# Patient Record
Sex: Female | Born: 2000
Health system: Southern US, Community
[De-identification: ages and names within clinical notes are randomized; demographics above are authoritative.]

## PROBLEM LIST (undated history)

## (undated) DIAGNOSIS — M419 Scoliosis, unspecified: Secondary | ICD-10-CM

## (undated) DIAGNOSIS — F988 Other specified behavioral and emotional disorders with onset usually occurring in childhood and adolescence: Secondary | ICD-10-CM

## (undated) DIAGNOSIS — T7840XA Allergy, unspecified, initial encounter: Secondary | ICD-10-CM

## (undated) HISTORY — PX: WISDOM TOOTH EXTRACTION: SHX21

## (undated) HISTORY — DX: Other specified behavioral and emotional disorders with onset usually occurring in childhood and adolescence: F98.8

## (undated) HISTORY — PX: SPINE SURGERY: SHX786

## (undated) HISTORY — DX: Scoliosis, unspecified: M41.9

## (undated) HISTORY — DX: Allergy, unspecified, initial encounter: T78.40XA

## (undated) NOTE — ED Notes (Signed)
 Formatting of this note might be different from the original. FU with pcp as needed. Return for worsening pain, hematuria, any concerns.  Tylenol  otc as directed prn pain.  Increase fluids.  Ice packs to back swelling as needed.   Delon Hadassah Dolly, RN 08/19/11 1627 Electronically signed by Delon Hadassah Dolly, RN at 08/19/2011  4:27 PM EDT
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
## 2019-02-10 ENCOUNTER — Ambulatory Visit: Payer: 59 | Attending: Internal Medicine

## 2019-02-10 DIAGNOSIS — Z20822 Contact with and (suspected) exposure to covid-19: Secondary | ICD-10-CM | POA: Insufficient documentation

## 2019-02-12 LAB — NOVEL CORONAVIRUS, NAA: SARS-CoV-2, NAA: NOT DETECTED

## 2019-02-28 ENCOUNTER — Telehealth: Payer: Self-pay | Admitting: Certified Nurse Midwife

## 2019-02-28 ENCOUNTER — Ambulatory Visit: Payer: 59

## 2019-03-02 NOTE — Telephone Encounter (Signed)
error 

## 2019-03-07 ENCOUNTER — Other Ambulatory Visit: Payer: Self-pay

## 2019-03-07 ENCOUNTER — Ambulatory Visit (INDEPENDENT_AMBULATORY_CARE_PROVIDER_SITE_OTHER): Payer: 59 | Admitting: Certified Nurse Midwife

## 2019-03-07 VITALS — BP 103/62 | HR 82 | Ht 67.0 in | Wt 153.8 lb

## 2019-03-07 DIAGNOSIS — Z3042 Encounter for surveillance of injectable contraceptive: Secondary | ICD-10-CM

## 2019-03-07 MED ORDER — MEDROXYPROGESTERONE ACETATE 150 MG/ML IM SUSP
150.0000 mg | Freq: Once | INTRAMUSCULAR | Status: AC
  Administered 2019-03-07: 150 mg via INTRAMUSCULAR

## 2019-03-07 NOTE — Progress Notes (Signed)
Date last pap: N/A. Last Depo-Provera: 12/13/2018. Side Effects if any: None, per patient. Serum HCG indicated? N/A, patient is within dates. Depo-Provera 150 mg IM given by: Park Meo, CMA. Next appointment due 4/19-06/06/2019.  BP 103/62   Pulse 82   Ht 5\' 7"  (1.702 m)   Wt 153 lb 12.8 oz (69.8 kg)   LMP  (LMP Unknown)   BMI 24.09 kg/m

## 2019-05-31 ENCOUNTER — Ambulatory Visit: Payer: 59

## 2019-06-01 NOTE — Progress Notes (Signed)
Date last pap: underaged. Last Depo-Provera: 03/07/2019. Side Effects if any: none. Serum HCG indicated? n/a. Depo-Provera 150 mg IM given by: Billy Fischer, LPN. Next appointment due July 15-29, 2021.   BP 125/73   Pulse 79   Ht 5\' 7"  (1.702 m)   Wt 159 lb 1.6 oz (72.2 kg)   BMI 24.92 kg/m

## 2019-06-02 ENCOUNTER — Other Ambulatory Visit: Payer: Self-pay

## 2019-06-02 ENCOUNTER — Ambulatory Visit (INDEPENDENT_AMBULATORY_CARE_PROVIDER_SITE_OTHER): Payer: 59 | Admitting: Certified Nurse Midwife

## 2019-06-02 ENCOUNTER — Other Ambulatory Visit: Payer: Self-pay | Admitting: Certified Nurse Midwife

## 2019-06-02 VITALS — BP 125/73 | HR 79 | Ht 67.0 in | Wt 159.1 lb

## 2019-06-02 DIAGNOSIS — Z3042 Encounter for surveillance of injectable contraceptive: Secondary | ICD-10-CM

## 2019-06-02 MED ORDER — MEDROXYPROGESTERONE ACETATE 150 MG/ML IM SUSP: 150.0000 mg | Freq: Once | INTRAMUSCULAR | Status: DC

## 2019-06-02 MED ORDER — MEDROXYPROGESTERONE ACETATE 150 MG/ML IM SUSP
150.0000 mg | Freq: Once | INTRAMUSCULAR | Status: AC
  Administered 2019-06-02: 150 mg via INTRAMUSCULAR

## 2019-06-02 NOTE — Telephone Encounter (Signed)
Pt is at the pharmacy and her depo in expired. She is requesting a refill. Sent to [facility]. Please advise pt has appt today

## 2019-08-30 ENCOUNTER — Ambulatory Visit: Payer: 59

## 2019-09-01 ENCOUNTER — Ambulatory Visit (INDEPENDENT_AMBULATORY_CARE_PROVIDER_SITE_OTHER): Payer: 59

## 2019-09-01 DIAGNOSIS — Z3042 Encounter for surveillance of injectable contraceptive: Secondary | ICD-10-CM

## 2019-09-01 MED ORDER — MEDROXYPROGESTERONE ACETATE 150 MG/ML IM SUSP
150.0000 mg | Freq: Once | INTRAMUSCULAR | Status: AC
  Administered 2019-09-01: 150 mg via INTRAMUSCULAR

## 2019-09-01 NOTE — Progress Notes (Signed)
Date last pap: underage. Last Depo-Provera: 06/02/2019 . Side Effects if any: none Serum HCG indicated? n/a Depo-Provera 150 mg IM given by: FHampton, LPN. Next appointment due Oct 14-Dec 01, 2019.

## 2019-10-12 DIAGNOSIS — H5213 Myopia, bilateral: Secondary | ICD-10-CM | POA: Diagnosis not present

## 2019-11-29 ENCOUNTER — Ambulatory Visit (INDEPENDENT_AMBULATORY_CARE_PROVIDER_SITE_OTHER): Payer: 59 | Admitting: Certified Nurse Midwife

## 2019-11-29 ENCOUNTER — Other Ambulatory Visit: Payer: Self-pay

## 2019-11-29 VITALS — BP 99/68 | HR 87

## 2019-11-29 DIAGNOSIS — Z3042 Encounter for surveillance of injectable contraceptive: Secondary | ICD-10-CM | POA: Diagnosis not present

## 2019-11-29 MED ORDER — MEDROXYPROGESTERONE ACETATE 150 MG/ML IM SUSP
150.0000 mg | Freq: Once | INTRAMUSCULAR | Status: AC
  Administered 2019-11-29: 150 mg via INTRAMUSCULAR

## 2019-11-29 NOTE — Progress Notes (Signed)
Date last pap: n/a Last Depo-Provera: 09/01/19. Side Effects if any: "may cause dizziness" Serum HCG indicated? n/a. Depo-Provera 150 mg IM given by: Darrold Junker, CMA Next appointment due 02/14/20 - 02/28/20

## 2020-02-24 ENCOUNTER — Ambulatory Visit: Payer: 59

## 2020-04-06 ENCOUNTER — Encounter: Payer: Self-pay | Admitting: Adult Health

## 2020-04-06 DIAGNOSIS — M41129 Adolescent idiopathic scoliosis, site unspecified: Secondary | ICD-10-CM | POA: Insufficient documentation

## 2020-04-06 NOTE — Progress Notes (Signed)
New Patient Office Visit  Subjective:  Patient ID: Kathleen Cohen, female    DOB: [birth year]  Age: 20 y.o. MRN: [id number]  CC:  Chief Complaint  Patient presents with  . New Patient (Initial Visit)    HPI Kathleen Cohen presents as a new patient to establish care, patient stats that she feels well today and has no complaints to address. Patient reports that she follows a well balanced diet, she is actively exercising 4-5x a week and reports that sleep habits are well.   She has not had Depoo this last time in January and did not take She does not want birth control.  She has ADD, she is not on medication.  Gynecology - she is age 74 - no history of previous pap will begin at age 66 unless clinically indicated. She has stopped her Depo and is not wanting any birth control at this time.   Patient  denies any fever, body aches,chills, rash, chest pain, shortness of breath, nausea, vomiting, or diarrhea.  Denies dizziness, lightheadedness, pre syncopal or syncopal episodes.   Past Medical History:  Diagnosis Date  . ADD (attention deficit disorder) 05/22/2014  . Allergy   . Scoliosis     Past Surgical History:  Procedure Laterality Date  . SPINE SURGERY     rods placed from C5 to T4 for Scoliosis  . WISDOM TOOTH EXTRACTION      Family History  Problem Relation Age of Onset  . Lupus Mother   . Alcoholism Father   . Depression Father   . Arthritis Maternal Grandmother        scleraderma  . Heart disease Maternal Grandfather   . Prostate cancer Paternal Grandfather   . Hyperlipidemia Paternal Grandfather   . Breast cancer Neg Hx   . Ovarian cancer Neg Hx   . Colon cancer Neg Hx     Social History   Socioeconomic History  . Marital status: Single    Spouse name: n/a  . Number of children: 0  . Years of education: Not on file  . Highest education level: Not on file  Occupational History  . Occupation: student    Comment: [employer]  Tobacco Use  .  Smoking status: Never Smoker  . Smokeless tobacco: Never Used  Vaping Use  . Vaping Use: Never used  Substance and Sexual Activity  . Alcohol use: No  . Drug use: No  . Sexual activity: Yes    Partners: Male    Birth control/protection: Injection, Condom  Other Topics Concern  . Not on file  Social History Narrative   Aryam is at [employer] learning sign language x 1 year and wants to move to [employer] [facility] to be flight attendant. She lives with both parents in the same home and her sister. She does well in school. She enjoys photography, reading, and gymnastics.    Social Determinants of Health   Financial Resource Strain: Not on file  Food Insecurity: Not on file  Transportation Needs: Not on file  Physical Activity: Not on file  Stress: Not on file  Social Connections: Not on file  Intimate Partner Violence: Not on file    ROS Review of Systems  All other systems reviewed and are negative.   Objective:   Today's Vitals: BP 117/80   Pulse 90   Temp 97.9 F (36.6 C) (Oral)   Resp 16   Ht 5\' 3"  (1.6 m)   Wt 170 lb 6.4 oz (77.3 kg)  SpO2 100%   BMI 30.19 kg/m   Physical Exam Vitals reviewed.  Constitutional:      General: She is not in acute distress.    Appearance: She is well-developed. She is not ill-appearing, toxic-appearing or diaphoretic.     Interventions: She is not intubated. HENT:     Head: Normocephalic and atraumatic.     Right Ear: External ear normal.     Left Ear: External ear normal.     Nose: Nose normal.     Mouth/Throat:     Pharynx: No oropharyngeal exudate.  Eyes:     General: Lids are normal. No scleral icterus.       Right eye: No discharge.        Left eye: No discharge.     Conjunctiva/sclera: Conjunctivae normal.     Right eye: Right conjunctiva is not injected. No exudate or hemorrhage.    Left eye: Left conjunctiva is not injected. No exudate or hemorrhage.    Pupils: Pupils are equal, round, and reactive to light.  Neck:     Thyroid: No  thyroid mass or thyromegaly.     Vascular: Normal carotid pulses. No carotid bruit, hepatojugular reflux or JVD.     Trachea: Trachea and phonation normal. No tracheal tenderness or tracheal deviation.     Meningeal: Brudzinski's sign and Kernig's sign absent.  Cardiovascular:     Rate and Rhythm: Normal rate and regular rhythm.     Pulses: Normal pulses.          Radial pulses are 2+ on the right side and 2+ on the left side.       Dorsalis pedis pulses are 2+ on the right side and 2+ on the left side.       Posterior tibial pulses are 2+ on the right side and 2+ on the left side.     Heart sounds: Normal heart sounds, S1 normal and S2 normal. Heart sounds not distant. No murmur heard. No friction rub. No gallop.   Pulmonary:     Effort: Pulmonary effort is normal. No tachypnea, bradypnea, accessory muscle usage or respiratory distress. She is not intubated.     Breath sounds: Normal breath sounds. No stridor. No wheezing, rhonchi or rales.  Chest:     Chest wall: No tenderness.  Breasts:     Right: No supraclavicular adenopathy.     Left: No supraclavicular adenopathy.    Abdominal:     General: Bowel sounds are normal. There is no distension or abdominal bruit.     Palpations: Abdomen is soft. There is no shifting dullness, fluid wave, hepatomegaly, splenomegaly, mass or pulsatile mass.     Tenderness: There is no abdominal tenderness. There is no guarding or rebound.     Hernia: No hernia is present.  Musculoskeletal:        General: No tenderness or deformity. Normal range of motion.     Cervical back: Full passive range of motion without pain, normal range of motion and neck supple. No edema, erythema or rigidity. No spinous process tenderness or muscular tenderness. Normal range of motion.  Lymphadenopathy:     Head:     Right side of head: No submental, submandibular, tonsillar, preauricular, posterior auricular or occipital adenopathy.     Left side of head: No submental,  submandibular, tonsillar, preauricular, posterior auricular or occipital adenopathy.     Cervical: No cervical adenopathy.     Right cervical: No superficial, deep or posterior cervical adenopathy.  Left cervical: No superficial, deep or posterior cervical adenopathy.     Upper Body:     Right upper body: No supraclavicular or pectoral adenopathy.     Left upper body: No supraclavicular or pectoral adenopathy.  Skin:    General: Skin is warm and dry.     Capillary Refill: Capillary refill takes less than 2 seconds.     Coloration: Skin is not pale.     Findings: No abrasion, bruising, burn, ecchymosis, erythema, lesion, petechiae or rash.     Nails: There is no clubbing.  Neurological:     Mental Status: She is alert and oriented to person, place, and time.     GCS: GCS eye subscore is 4. GCS verbal subscore is 5. GCS motor subscore is 6.     Cranial Nerves: No cranial nerve deficit.     Sensory: No sensory deficit.     Motor: No weakness, tremor, atrophy, abnormal muscle tone or seizure activity.     Coordination: Coordination normal.     Gait: Gait normal.     Deep Tendon Reflexes: Reflexes are normal and symmetric. Reflexes normal. Babinski sign absent on the right side. Babinski sign absent on the left side.     Reflex Scores:      Tricep reflexes are 2+ on the right side and 2+ on the left side.      Bicep reflexes are 2+ on the right side and 2+ on the left side.      Brachioradialis reflexes are 2+ on the right side and 2+ on the left side.      Patellar reflexes are 2+ on the right side and 2+ on the left side.      Achilles reflexes are 2+ on the right side and 2+ on the left side. Psychiatric:        Mood and Affect: Mood normal.        Speech: Speech normal.        Behavior: Behavior normal.        Thought Content: Thought content normal.        Judgment: Judgment normal.     Assessment & Plan:   Problem List Items Addressed This Visit      Other   ADD  (attention deficit disorder)   Screening for blood or protein in urine   BMI 30.0-30.9,adult - Primary   Vitamin D deficiency     Orders Placed This Encounter  Procedures  . CBC with Differential/Platelet  . Comprehensive Metabolic Panel (CMET)  . TSH  . VITAMIN D 25 Hydroxy (Vit-D Deficiency, Fractures)  . Lipid Panel w/o Chol/HDL Ratio    Outpatient Encounter Medications as of 04/09/2020  Medication Sig  . [DISCONTINUED] medroxyPROGESTERone Acetate 150 MG/ML SUSY INJECT 1 ML INTO THE MUSCLE EVERY 3 MONTHS. (Patient not taking: Reported on 04/09/2020)   No facility-administered encounter medications on file as of 04/09/2020.  BMI 30.0-30.9,adult  Vitamin D deficiency  Screening for blood or protein in urine - Plan: CANCELED: POCT urinalysis dipstick  Attention deficit disorder, unspecified hyperactivity presence - Plan: CBC with Differential/Platelet, Comprehensive Metabolic Panel (CMET), TSH, VITAMIN D 25 Hydroxy (Vit-D Deficiency, Fractures), Lipid Panel w/o Chol/HDL Ratio managing ADHD declines needs for medications currently.   Follow-up: Return if symptoms worsen or fail to improve, for at any time for any worsening symptoms, Go to Emergency room/ urgent care if worse.   Jairo Ben, FNP

## 2020-04-09 ENCOUNTER — Ambulatory Visit (INDEPENDENT_AMBULATORY_CARE_PROVIDER_SITE_OTHER): Payer: No Typology Code available for payment source | Admitting: Adult Health

## 2020-04-09 ENCOUNTER — Encounter: Payer: Self-pay | Admitting: Adult Health

## 2020-04-09 ENCOUNTER — Other Ambulatory Visit: Payer: Self-pay

## 2020-04-09 VITALS — BP 117/80 | HR 90 | Temp 97.9°F | Resp 16 | Ht 63.0 in | Wt 170.4 lb

## 2020-04-09 DIAGNOSIS — E559 Vitamin D deficiency, unspecified: Secondary | ICD-10-CM

## 2020-04-09 DIAGNOSIS — Z1389 Encounter for screening for other disorder: Secondary | ICD-10-CM | POA: Diagnosis not present

## 2020-04-09 DIAGNOSIS — F988 Other specified behavioral and emotional disorders with onset usually occurring in childhood and adolescence: Secondary | ICD-10-CM | POA: Diagnosis not present

## 2020-04-09 DIAGNOSIS — Z6830 Body mass index (BMI) 30.0-30.9, adult: Secondary | ICD-10-CM | POA: Diagnosis not present

## 2020-04-09 NOTE — Patient Instructions (Signed)
Health Maintenance, Female Adopting a healthy lifestyle and getting preventive care are important in promoting health and wellness. Ask your health care provider about:  The right schedule for you to have regular tests and exams.  Things you can do on your own to prevent diseases and keep yourself healthy. What should I know about diet, weight, and exercise? Eat a healthy diet  Eat a diet that includes plenty of vegetables, fruits, low-fat dairy products, and lean protein.  Do not eat a lot of foods that are high in solid fats, added sugars, or sodium.   Maintain a healthy weight Body mass index (BMI) is used to identify weight problems. It estimates body fat based on height and weight. Your health care provider can help determine your BMI and help you achieve or maintain a healthy weight. Get regular exercise Get regular exercise. This is one of the most important things you can do for your health. Most adults should:  Exercise for at least 150 minutes each week. The exercise should increase your heart rate and make you sweat (moderate-intensity exercise).  Do strengthening exercises at least twice a week. This is in addition to the moderate-intensity exercise.  Spend less time sitting. Even light physical activity can be beneficial. Watch cholesterol and blood lipids Have your blood tested for lipids and cholesterol at 20 years of age, then have this test every 5 years. Have your cholesterol levels checked more often if:  Your lipid or cholesterol levels are high.  You are older than 21 years of age.  You are at high risk for heart disease. What should I know about cancer screening? Depending on your health history and family history, you may need to have cancer screening at various ages. This may include screening for:  Breast cancer.  Cervical cancer.  Colorectal cancer.  Skin cancer.  Lung cancer. What should I know about heart disease, diabetes, and high blood  pressure? Blood pressure and heart disease  High blood pressure causes heart disease and increases the risk of stroke. This is more likely to develop in people who have high blood pressure readings, are of African descent, or are overweight.  Have your blood pressure checked: ? Every 3-5 years if you are 18-39 years of age. ? Every year if you are 40 years old or older. Diabetes Have regular diabetes screenings. This checks your fasting blood sugar level. Have the screening done:  Once every three years after age 40 if you are at a normal weight and have a low risk for diabetes.  More often and at a younger age if you are overweight or have a high risk for diabetes. What should I know about preventing infection? Hepatitis B If you have a higher risk for hepatitis B, you should be screened for this virus. Talk with your health care provider to find out if you are at risk for hepatitis B infection. Hepatitis C Testing is recommended for:  Everyone born from 1945 through 1965.  Anyone with known risk factors for hepatitis C. Sexually transmitted infections (STIs)  Get screened for STIs, including gonorrhea and chlamydia, if: ? You are sexually active and are younger than 21 years of age. ? You are older than 21 years of age and your health care provider tells you that you are at risk for this type of infection. ? Your sexual activity has changed since you were last screened, and you are at increased risk for chlamydia or gonorrhea. Ask your health care provider   if you are at risk.  Ask your health care provider about whether you are at high risk for HIV. Your health care provider may recommend a prescription medicine to help prevent HIV infection. If you choose to take medicine to prevent HIV, you should first get tested for HIV. You should then be tested every 3 months for as long as you are taking the medicine. Pregnancy  If you are about to stop having your period (premenopausal) and  you may become pregnant, seek counseling before you get pregnant.  Take 400 to 800 micrograms (mcg) of folic acid every day if you become pregnant.  Ask for birth control (contraception) if you want to prevent pregnancy. Osteoporosis and menopause Osteoporosis is a disease in which the bones lose minerals and strength with aging. This can result in bone fractures. If you are 65 years old or older, or if you are at risk for osteoporosis and fractures, ask your health care provider if you should:  Be screened for bone loss.  Take a calcium or vitamin D supplement to lower your risk of fractures.  Be given hormone replacement therapy (HRT) to treat symptoms of menopause. Follow these instructions at home: Lifestyle  Do not use any products that contain nicotine or tobacco, such as cigarettes, e-cigarettes, and chewing tobacco. If you need help quitting, ask your health care provider.  Do not use street drugs.  Do not share needles.  Ask your health care provider for help if you need support or information about quitting drugs. Alcohol use  Do not drink alcohol if: ? Your health care provider tells you not to drink. ? You are pregnant, may be pregnant, or are planning to become pregnant.  If you drink alcohol: ? Limit how much you use to 0-1 drink a day. ? Limit intake if you are breastfeeding.  Be aware of how much alcohol is in your drink. In the [place] one drink equals one 12 oz bottle of beer (355 mL), one 5 oz glass of wine (148 mL), or one 1 oz glass of hard liquor (44 mL). General instructions  Schedule regular health, dental, and eye exams.  Stay current with your vaccines.  Tell your health care provider if: ? You often feel depressed. ? You have ever been abused or do not feel safe at home. Summary  Adopting a healthy lifestyle and getting preventive care are important in promoting health and wellness.  Follow your health care provider's instructions about healthy  diet, exercising, and getting tested or screened for diseases.  Follow your health care provider's instructions on monitoring your cholesterol and blood pressure. This information is not intended to replace advice given to you by your health care provider. Make sure you discuss any questions you have with your health care provider. Document Revised: 01/13/2018 Document Reviewed: 01/13/2018 Elsevier Patient Education  2021 Elsevier Inc.  

## 2020-08-02 ENCOUNTER — Telehealth: Payer: Self-pay | Admitting: Certified Nurse Midwife

## 2020-08-02 NOTE — Telephone Encounter (Signed)
Pt called stating that she stop her birth control in January and has had a constant period since the end of April. Pt is asking if this is normal or if she needs to be seeen. Please Advise.

## 2020-08-15 ENCOUNTER — Encounter: Payer: No Typology Code available for payment source | Admitting: Certified Nurse Midwife

## 2021-01-21 ENCOUNTER — Other Ambulatory Visit: Payer: Self-pay

## 2021-01-21 ENCOUNTER — Ambulatory Visit
Admission: RE | Admit: 2021-01-21 | Discharge: 2021-01-21 | Disposition: A | Discharge status: Home / Self Care | Payer: No Typology Code available for payment source | Source: Ambulatory Visit | Attending: Family Medicine | Admitting: Family Medicine

## 2021-01-21 VITALS — BP 129/80 | HR 64 | Temp 98.1°F | Resp 18

## 2021-01-21 DIAGNOSIS — L723 Sebaceous cyst: Secondary | ICD-10-CM

## 2021-01-21 DIAGNOSIS — L0231 Cutaneous abscess of buttock: Secondary | ICD-10-CM

## 2021-01-21 DIAGNOSIS — L089 Local infection of the skin and subcutaneous tissue, unspecified: Secondary | ICD-10-CM | POA: Diagnosis not present

## 2021-01-21 MED ORDER — DOXYCYCLINE HYCLATE 100 MG PO CAPS
100.0000 mg | ORAL_CAPSULE | Freq: Two times a day (BID) | ORAL | 0 refills | Status: DC
  Filled 2021-01-21: qty 20, 10d supply, fill #0

## 2021-01-21 NOTE — ED Triage Notes (Signed)
Pt here with abscess to lower right buttock that has been draining and getting better with warm compresses.

## 2021-01-21 NOTE — ED Provider Notes (Signed)
Renaldo Fiddler    CSN: [id number] Arrival date & time: 01/21/21  0841      History   Chief Complaint Chief Complaint  Patient presents with   Abscess    HPI Kathleen Cohen is a 20 y.o. female.   HPI Patient presents today with a abscess involving the left buttocks.  She reports noticing the abscess a couple days ago however she attempted to apply warm compresses yesterday and felt the abscess burst. She has history of recurrent buttock abscesses not requiring I&D. Denies fever or N&V.  Past Medical History:  Diagnosis Date   ADD (attention deficit disorder) 05/22/2014   Allergy    Scoliosis     Patient Active Problem List   Diagnosis Date Noted   Screening for blood or protein in urine 04/09/2020   BMI 30.0-30.9,adult 04/09/2020   Vitamin D deficiency 04/09/2020   Adolescent idiopathic scoliosis 04/06/2020   ADD (attention deficit disorder) 05/22/2014   Scoliosis 09/21/2011    Past Surgical History:  Procedure Laterality Date   SPINE SURGERY     rods placed from C5 to T4 for Scoliosis   WISDOM TOOTH EXTRACTION      OB History     Gravida  0   Para  0   Term  0   Preterm  0   AB  0   Living  0      SAB  0   IAB  0   Ectopic  0   Multiple  0   Live Births  0            Home Medications    Prior to Admission medications   Medication Sig Start Date End Date Taking? Authorizing Provider  doxycycline (VIBRAMYCIN) 100 MG capsule Take 1 capsule (100 mg total) by mouth 2 (two) times daily. 01/21/21  Yes Bing Neighbors, FNP  medroxyPROGESTERone Acetate 150 MG/ML SUSY INJECT 1 ML INTO THE MUSCLE EVERY 3 MONTHS. Patient not taking: Reported on 04/09/2020 06/02/19 04/09/20  Doreene Burke, CNM    Family History Family History  Problem Relation Age of Onset   Lupus Mother    Alcoholism Father    Depression Father    Arthritis Maternal Grandmother        scleraderma   Heart disease Maternal Grandfather    Prostate cancer  Paternal Grandfather    Hyperlipidemia Paternal Grandfather    Breast cancer Neg Hx    Ovarian cancer Neg Hx    Colon cancer Neg Hx     Social History Social History   Tobacco Use   Smoking status: Never   Smokeless tobacco: Never  Vaping Use   Vaping Use: Never used  Substance Use Topics   Alcohol use: No   Drug use: No     Allergies   Neosporin  [neomycin-bacitracin zn-polymyx] and Neosporin [neomycin-polymyxin-gramicidin]   Review of Systems Review of Systems Pertinent negatives listed in HPI  Physical Exam Triage Vital Signs ED Triage Vitals  Enc Vitals Group     BP 01/21/21 0909 129/80     Pulse Rate 01/21/21 0909 64     Resp 01/21/21 0909 18     Temp 01/21/21 0909 98.1 F (36.7 C)     Temp src --      SpO2 01/21/21 0909 98 %     Weight --      Height --      Head Circumference --      Peak Flow --  Pain Score 01/21/21 0911 0     Pain Loc --      Pain Edu? --      Excl. in GC? --    No data found.  Updated Vital Signs BP 129/80    Pulse 64    Temp 98.1 F (36.7 C)    Resp 18    SpO2 98%   Visual Acuity Right Eye Distance:   Left Eye Distance:   Bilateral Distance:    Right Eye Near:   Left Eye Near:    Bilateral Near:     Physical Exam Constitutional:      Appearance: Normal appearance.  Cardiovascular:     Rate and Rhythm: Normal rate and regular rhythm.  Pulmonary:     Effort: Pulmonary effort is normal.     Breath sounds: Normal breath sounds.  Skin:      Neurological:     Mental Status: She is alert.     UC Treatments / Results  Labs (all labs ordered are listed, but only abnormal results are displayed) Labs Reviewed - No data to display  EKG   Radiology No results found.  Procedures Procedures (including critical care time)  Medications Ordered in UC Medications - No data to display  Initial Impression / Assessment and Plan / UC Course  I have reviewed the triage vital signs and the nursing  notes.  Pertinent labs & imaging results that were available during my care of the patient were reviewed by me and considered in my medical decision making (see chart for details).    Abscess of left buttock with infection visible Start Doxycyline 100 mg BID x 10 days, I&D not indicated, abscess completely drained with residual erytnema , tenderness, and purulent dried drainage present. Strict return precautions given Final Clinical Impressions(s) / UC Diagnoses   Final diagnoses:  Abscess of left buttock  Infected sebaceous cyst of skin     Discharge Instructions      The abscess has drained fully, however treating for skin infection. Take Doxycyline 100 mg BID  with food for 10 days. Return as needed.     ED Prescriptions     Medication Sig Dispense Auth. Provider   doxycycline (VIBRAMYCIN) 100 MG capsule Take 1 capsule (100 mg total) by mouth 2 (two) times daily. 20 capsule Bing Neighbors, FNP      PDMP not reviewed this encounter.   Bing Neighbors, FNP 01/21/21 1047

## 2021-01-21 NOTE — Discharge Instructions (Addendum)
The abscess has drained fully, however treating for skin infection. Take Doxycyline 100 mg BID  with food for 10 days. Return as needed.

## 2021-06-04 ENCOUNTER — Ambulatory Visit: Payer: Self-pay | Admitting: *Deleted

## 2021-06-04 NOTE — Telephone Encounter (Signed)
?  Chief Complaint: tingling in finger tips- on/off x1 month, anxiety ?Symptoms: tingling finger tips, chest tightness- anxiety when gets symptoms ?Frequency: 3 times this week- 1 month ?Pertinent Negatives: Patient denies headache, dizziness, vision loss, double vision, changes in speech, unsteady on your feet- although patient states she has headache yesterday ?Disposition: [] ED /[] Urgent Care (no appt availability in office) / [x] Appointment(In office/virtual)/ []  [facility]/ [] Home Care/ [] Refused Recommended Disposition /[]  Mobile Bus/ []  Follow-up with PCP ?Additional Notes:    ?

## 2021-06-04 NOTE — Telephone Encounter (Signed)
Patient is requesting appointment- no symptoms at this time ?Random finger tingling, some dizziness ?Reason for Disposition ? [1] Numbness or tingling in one or both hands AND [2] is a chronic symptom (recurrent or ongoing AND present > 4 weeks) ? ?Answer Assessment - Initial Assessment Questions ?1. SYMPTOM: "What is the main symptom you are concerned about?" (e.g., weakness, numbness) ?    Random finger tingling, some dizziness ?2. ONSET: "When did this start?" (minutes, hours, days; while sleeping) ?    1 month- 3 times this week ?3. LAST NORMAL: "When was the last time you (the patient) were normal (no symptoms)?" ?    1 month ago ?4. PATTERN "Does this come and go, or has it been constant since it started?"  "Is it present now?" ?    Comes and goes ?5. CARDIAC SYMPTOMS: "Have you had any of the following symptoms: chest pain, difficulty breathing, palpitations?" ?    Some chest pain at moment of symptoms- patient believes anxiety ?6. NEUROLOGIC SYMPTOMS: "Have you had any of the following symptoms: headache, dizziness, vision loss, double vision, changes in speech, unsteady on your feet?" ?    No ?7. OTHER SYMPTOMS: "Do you have any other symptoms?" ?    Migraine yesterday ?8. PREGNANCY: "Is there any chance you are pregnant?" "When was your last menstrual period?" ?    Yes, LMP- last week ? ?Protocols used: Neurologic Deficit-A-AH ? ?

## 2021-06-05 ENCOUNTER — Ambulatory Visit (INDEPENDENT_AMBULATORY_CARE_PROVIDER_SITE_OTHER): Payer: No Typology Code available for payment source | Admitting: Physician Assistant

## 2021-06-05 ENCOUNTER — Encounter: Payer: Self-pay | Admitting: Physician Assistant

## 2021-06-05 ENCOUNTER — Other Ambulatory Visit: Payer: Self-pay

## 2021-06-05 VITALS — BP 122/77 | HR 118 | Temp 97.9°F | Resp 16 | Wt 163.9 lb

## 2021-06-05 DIAGNOSIS — R200 Anesthesia of skin: Secondary | ICD-10-CM

## 2021-06-05 DIAGNOSIS — E559 Vitamin D deficiency, unspecified: Secondary | ICD-10-CM

## 2021-06-05 DIAGNOSIS — R202 Paresthesia of skin: Secondary | ICD-10-CM | POA: Diagnosis not present

## 2021-06-05 DIAGNOSIS — F411 Generalized anxiety disorder: Secondary | ICD-10-CM

## 2021-06-05 MED ORDER — FLUOXETINE HCL 10 MG PO CAPS
10.0000 mg | ORAL_CAPSULE | Freq: Every day | ORAL | 1 refills | Status: DC
  Filled 2021-06-05: qty 90, 90d supply, fill #0

## 2021-06-05 NOTE — Assessment & Plan Note (Addendum)
May be nerve compression at elbow, not convincing for carpal tunnel ?Advised keeping arms straight at night, could wear elbow compression sleeve. ?Will check labs ?May also be related to anxiety, advised to monitor occurences ?

## 2021-06-05 NOTE — Assessment & Plan Note (Signed)
Discussed therapy, gave resources and will refer to a psychology office. ?Discussed SSRI medication, rx prozac 10 mg, pt can decide if she wants to take ?Advised potential side effects ?F/u 2 mo ?

## 2021-06-05 NOTE — Patient Instructions (Addendum)
[facility] ?[email or web address] ? ? ?

## 2021-06-05 NOTE — Progress Notes (Signed)
?  ? ?I,Joseline E Rosas,acting as a scribe for [employer], PA-C.,have documented all relevant documentation on the behalf of Alfredia Ferguson, PA-C,as directed by  Alfredia Ferguson, PA-C while in the presence of Alfredia Ferguson, PA-C.  ? ?Established patient visit ? ? ?Patient: Kathleen Cohen   DOB: 06/07/2000   21 y.o. Female  MRN: [id number] ?Visit Date: 06/05/2021 ? ?Today's healthcare provider: Alfredia Ferguson, PA-C  ? ?Chief Complaint  ?Patient presents with  ? Tingling  ? ?Subjective  ?  ?HPI  ?Patient is a 21 year old female with PMH of C5-T4 rods for corrective scoliosis surgery presents today with concerns of tingling sensation in fingers tips. Patient reports that for the past 3 months she has been noticing it once a month but for the last week it has happened twice. ?No real pattern to when it happens, describes after playing disc golf, when editing photos, while on a walk.  ?Sometimes when she lies flat will feel it in her left lateral three fingers. ?Denies weakness, discoloration, injury, pain.  ? ?She also admits to significant anxiety, tearfulness, physical anxiety. In a stable 4 year relationship, lives with partner. Reports feeling mood changes over the last 4 years. ? ?Medications: ?Outpatient Medications Prior to Visit  ?Medication Sig  ? [DISCONTINUED] doxycycline (VIBRAMYCIN) 100 MG capsule Take 1 capsule (100 mg total) by mouth 2 (two) times daily.  ? ?No facility-administered medications prior to visit.  ? ? ?Review of Systems  ?Constitutional:  Negative for fatigue and fever.  ?Respiratory:  Negative for cough and shortness of breath.   ?Cardiovascular:  Negative for chest pain and leg swelling.  ?Gastrointestinal:  Negative for abdominal pain.  ?Neurological:  Negative for dizziness and headaches.  ? ? ?  Objective  ?  ?BP 122/77 (BP Location: Left Arm, Patient Position: Sitting, Cuff Size: Normal)   Pulse (!) 118   Temp 97.9 ?F (36.6 ?C) (Oral)   Resp 16   Wt 163 lb 14.4 oz (74.3 kg)    BMI 29.03 kg/m?  ? ? ?Physical Exam ?Constitutional:   ?   General: She is awake.  ?   Appearance: She is well-developed.  ?HENT:  ?   Head: Normocephalic.  ?Eyes:  ?   Conjunctiva/sclera: Conjunctivae normal.  ?Cardiovascular:  ?   Rate and Rhythm: Normal rate and regular rhythm.  ?   Heart sounds: Normal heart sounds.  ?Pulmonary:  ?   Effort: Pulmonary effort is normal.  ?   Breath sounds: Normal breath sounds.  ?Musculoskeletal:  ?   Comments: Negative phalen's test  ?No reported paresthesia with shoulder ROM  ?Skin: ?   General: Skin is warm.  ?Neurological:  ?   Mental Status: She is alert and oriented to person, place, and time.  ?Psychiatric:     ?   Attention and Perception: Attention normal.     ?   Mood and Affect: Mood normal. Affect is tearful.     ?   Speech: Speech normal.     ?   Behavior: Behavior is cooperative.  ?  ? ?No results found for any visits on 06/05/21. ? Assessment & Plan  ?  ? ?Problem List Items Addressed This Visit   ? ?  ? Other  ? Numbness and tingling in both hands - Primary  ?  May be nerve compression at elbow, not convincing for carpal tunnel ?Advised keeping arms straight at night, could wear elbow compression sleeve. ?Will check labs ?May also  be related to anxiety, advised to monitor occurences ? ?  ?  ? Relevant Orders  ? HgB A1c  ? TSH + free T4  ? Vitamin B12  ? GAD (generalized anxiety disorder)  ?  Discussed therapy, gave resources and will refer to a psychology office. ?Discussed SSRI medication, rx prozac 10 mg, pt can decide if she wants to take ?Advised potential side effects ?F/u 2 mo ? ?  ?  ? Relevant Medications  ? FLUoxetine (PROZAC) 10 MG capsule  ? Other Relevant Orders  ? Ambulatory referral to Psychology  ? ?Other Visit Diagnoses   ? ? Avitaminosis D      ? Relevant Orders  ? Vitamin D (25 hydroxy)  ? ?  ?  ? ?Return in about 8 weeks (around 07/31/2021) for anxiety.  ?   ? ?I, Alfredia Ferguson, PA-C have reviewed all documentation for this visit. The  documentation on  06/05/2021  for the exam, diagnosis, procedures, and orders are all accurate and complete. ? ?Alfredia Ferguson, PA-C ?[facility] ?[address] ?[place], [place], [address] ?Office: [phone] ?Fax: [phone]  ? ?[facility]  ?

## 2021-06-06 LAB — TSH+FREE T4
Free T4: 1.21 ng/dL (ref 0.82–1.77)
TSH: 1.18 u[IU]/mL (ref 0.450–4.500)

## 2021-06-06 LAB — HEMOGLOBIN A1C
Est. average glucose Bld gHb Est-mCnc: 94 mg/dL
Hgb A1c MFr Bld: 4.9 % (ref 4.8–5.6)

## 2021-06-06 LAB — VITAMIN D 25 HYDROXY (VIT D DEFICIENCY, FRACTURES): Vit D, 25-Hydroxy: 27.3 ng/mL — ABNORMAL LOW (ref 30.0–100.0)

## 2021-06-06 LAB — VITAMIN B12: Vitamin B-12: 514 pg/mL (ref 232–1245)

## 2021-06-10 ENCOUNTER — Encounter (INDEPENDENT_AMBULATORY_CARE_PROVIDER_SITE_OTHER): Payer: No Typology Code available for payment source | Admitting: Physician Assistant

## 2021-06-10 DIAGNOSIS — G43109 Migraine with aura, not intractable, without status migrainosus: Secondary | ICD-10-CM

## 2021-06-10 DIAGNOSIS — H53131 Sudden visual loss, right eye: Secondary | ICD-10-CM | POA: Diagnosis not present

## 2021-06-10 NOTE — Progress Notes (Signed)
Please see the MyChart message reply(ies) for my assessment and plan.  ?  ?This patient gave consent for this Medical Advice Message and is aware that it may result in a bill to [employer], as well as the possibility of receiving a bill for a co-payment or deductible. They are an established patient, but are not seeking medical advice exclusively about a problem treated during an in person or video visit in the last seven days. I did not recommend an in person or video visit within seven days of my reply.  ?  ?I spent a total of 15 minutes cumulative time within 7 days through [employer]. ? ?Called patient regarding mychart message due to complicated nature of concern regarding loss of vision; nothing to do with what we discussed in last visit. ? ?It was like "rain on a windshield" in her right eye, blurry and then loss of vision, lasted from start to finish around 40 minutes. When vision returned she got a headache, took advil, headache now gone. Vision back to normal. Denies any associated weakness, change in speech, numbness.  ? ?She reports a severe headache last week; nausea, vomiting, sensitivity to light. Eventually went away.  ? ?Reports one prior episode of vision loss w/ headache years ago.  ? ?Advised pt if this issue reoccurs- any vision changes or loss, needs to go to ED. ? ?May be ocular migraine, but needs to go to ED in case of other conditions.  ? ?Ref to neurology.  ? ?Alfredia Ferguson, PA-C  ?

## 2021-06-13 NOTE — Progress Notes (Signed)
? ?Referring:  ?Alfredia Ferguson, PA-C ?[address] ?[place],  [place] [address] ? ?PCP: ?Alfredia Ferguson, PA-C ? ?Neurology was asked to evaluate Kathleen Cohen, a 21 year old female for a chief complaint of headaches.  Our recommendations of care will be communicated by shared medical record.   ? ?CC:  headaches ? ?History provided from self ? ?HPI:  ?Medical co-morbidities: GAD, vitamin D deficiency, ADD, scoliosis s/p surgery ? ?The patient presents for evaluation of headaches which began one month ago. Headaches are described as right sided sharp pain with associated photophobia, nausea, and vomiting. She will also have a visual aura of lines in her right field of vision which lasts ~30-40 minutes at a time. Prior to this month she would occasionally have these visual changes which were not associated with headache. Headaches can last 3-12 hours at a time. She has tried ibuprofen previously but this has not been very effective. ? ?She also reports intermittent numbness/tingling in her left 4th and 5th fingers down to her wrist. She denies radicular pains. Does not believe this is associated with her headache or visual aura. ? ?Headache History: ?Onset: 1 month ago ?Triggers: none ?Aura: lines in her right field of vision (lasted 40 minutes) ?Location: right ?Quality/Description: sharp ?Associated Symptoms: ? Photophobia: yes ? Phonophobia: no ? Nausea: yes ?Vomiting: yes ?Worse with activity?: yes ?Duration of headaches: 3-12 hours ? ?Headache days per month: 4 ?Headache free days per month: 26 ? ?Current Treatment: ?Abortive ?ibuprofen ? ?Preventative ?none ? ?Prior Therapies                                 ?Prozac 10 mg daily ?Mobic ?Flexeril ? ?LABS: ?CBC ?   ?Component Value Date/Time  ? WBC 5.1 06/17/2017 0835  ? RBC 4.83 06/17/2017 0835  ? HGB 12.5 06/17/2017 0835  ? HCT 38.0 06/17/2017 0835  ? PLT 312.0 06/17/2017 0835  ? MCV 78.7 06/17/2017 0835  ? MCV 81.4 11/08/2014 1447  ? MCH 25.7 (A)  11/08/2014 1447  ? MCHC 32.8 06/17/2017 0835  ? RDW 15.7 (H) 06/17/2017 0835  ? LYMPHSABS 1.6 06/17/2017 0835  ? MONOABS 0.5 06/17/2017 0835  ? EOSABS 0.1 06/17/2017 0835  ? BASOSABS 0.0 06/17/2017 0835  ? ? ?  Latest Ref Rng & Units 06/17/2017  ?  8:35 AM 11/08/2014  ?  2:41 PM  ?CMP  ?Glucose 70 - 99 mg/dL 79   97    ?BUN 6 - 23 mg/dL 11   11    ?Creatinine 0.40 - 1.20 mg/dL 6.23   7.62    ?Sodium 135 - 145 mEq/L 139   136    ?Potassium 3.5 - 5.1 mEq/L 4.1   3.9    ?Chloride 96 - 112 mEq/L 106   102    ?CO2 19 - 32 mEq/L 24   25    ?Calcium 8.4 - 10.5 mg/dL 83.1   9.7    ?Total Protein 6.0 - 8.3 g/dL 7.6   8.0    ?Total Bilirubin 0.3 - 1.2 mg/dL 0.5   0.7    ?Alkaline Phos 47 - 119 U/L 59   99    ?AST 0 - 37 U/L 16   15    ?ALT 0 - 35 U/L 14   11    ? ? ? ?IMAGING:  ?none ? ? ?Current Outpatient Medications on File Prior to Visit  ?Medication Sig Dispense  Refill  ? FLUoxetine (PROZAC) 10 MG capsule Take 1 capsule (10 mg total) by mouth daily. 90 capsule 1  ? [DISCONTINUED] medroxyPROGESTERone Acetate 150 MG/ML SUSY INJECT 1 ML INTO THE MUSCLE EVERY 3 MONTHS. (Patient not taking: Reported on 04/09/2020) 1 mL 3  ? ?No current facility-administered medications on file prior to visit.  ? ? ? ?Allergies: ?Allergies  ?Allergen Reactions  ? Neosporin  [Neomycin-Bacitracin Zn-Polymyx] Rash and Swelling  ? Neosporin [Neomycin-Polymyxin-Gramicidin] Rash  ? ? ?Family History: ?Migraine or other headaches in the family:  aunt  ?Aneurysms in a first degree relative:  no ?Brain tumors in the family:  no ?Other neurological illness in the family:   no ? ?Past Medical History: ?Past Medical History:  ?Diagnosis Date  ? ADD (attention deficit disorder) 05/22/2014  ? Allergy   ? Scoliosis   ? ? ?Past Surgical History ?Past Surgical History:  ?Procedure Laterality Date  ? SPINE SURGERY    ? rods placed from C5 to T4 for Scoliosis  ? WISDOM TOOTH EXTRACTION    ? ? ?Social History: ?Social History  ? ?Tobacco Use  ? Smoking status: Never   ? Smokeless tobacco: Never  ?Vaping Use  ? Vaping Use: Never used  ?Substance Use Topics  ? Alcohol use: No  ? Drug use: No  ? ? ?ROS: ?Negative for fevers, chills. Positive for headaches. All other systems reviewed and negative unless stated otherwise in HPI. ? ? ?Physical Exam:  ? ?Vital Signs: ?BP 117/77   Pulse (!) 109   Ht 5\' 3"  (1.6 m)   Wt 165 lb 9.6 oz (75.1 kg)   BMI 29.33 kg/m?  ?GENERAL: well appearing,in no acute distress,alert ?SKIN:  Color, texture, turgor normal. No rashes or lesions ?HEAD:  Normocephalic/atraumatic. ?CV:  RRR ?RESP: Normal respiratory effort ?MSK: no tenderness to palpation over occiput, neck, or shoulders ? ?NEUROLOGICAL: ?Mental Status: Alert, oriented to person, place and time,Follows commands ?Cranial Nerves: PERRL, visual fields intact to confrontation, extraocular movements intact, facial sensation intact, no facial droop or ptosis, hearing grossly intact, no dysarthria ?Motor: muscle strength 5/5 both upper and lower extremities,no drift, normal tone ?Reflexes: 2+ throughout ?Sensation: intact to light touch all 4 extremities ?Coordination: Finger-to- nose-finger intact bilaterally ?Gait: normal-based ? ? ?IMPRESSION: ?21 year old female with a history of GAD, vitamin D deficiency, ADD, scoliosis s/p surgery who presents for evaluation of headaches and visual changes. Her headache pattern is consistent with episodic migraine with aura. Will start Maxalt as needed for migraine rescue. She will monitor her hand tingling to see if this is associated with her headaches. Otherwise this may represent an ulnar neuropathy. May consider EMG if this does not improve with conservative measures. ? ?PLAN: ?-Rescue: Start Maxalt 10 mg PRN ?-next steps: consider propranolol for migraine prevention (may also help with anxiety) ? ? ?I spent a total of 26 minutes chart reviewing and counseling the patient. Headache education was done. Discussed treatment options including  acute  medications and natural supplements. Discussed medication overuse headache and to limit use of acute treatments to no more than 2 days/week or 10 days/month. Discussed medication side effects, adverse reactions and drug interactions. Written educational materials and patient instructions outlining all of the above were given. ? ?Follow-up: 4 months ? ? ?21, MD ?06/14/2021   ?9:43 AM ? ? ?

## 2021-06-14 ENCOUNTER — Ambulatory Visit: Payer: No Typology Code available for payment source | Admitting: Psychiatry

## 2021-06-14 ENCOUNTER — Other Ambulatory Visit: Payer: Self-pay

## 2021-06-14 ENCOUNTER — Encounter: Payer: Self-pay | Admitting: Psychiatry

## 2021-06-14 VITALS — BP 117/77 | HR 109 | Ht 63.0 in | Wt 165.6 lb

## 2021-06-14 DIAGNOSIS — G43109 Migraine with aura, not intractable, without status migrainosus: Secondary | ICD-10-CM

## 2021-06-14 MED ORDER — RIZATRIPTAN BENZOATE 10 MG PO TABS
10.0000 mg | ORAL_TABLET | ORAL | 3 refills | Status: DC | PRN
  Filled 2021-06-14: qty 10, 30d supply, fill #0

## 2021-06-14 NOTE — Patient Instructions (Addendum)
Start Maxalt 10 mg as needed for migraine. Take at the onset of migraine. If headache recurs or does not fully resolve, you may take a second dose after 2 hours. Please avoid taking more than 2 days per week to avoid rebound headache ? ?Migraine with Aura ? ?Your headaches are consistent with migraines. Migraines are moderate to severe headaches which generally have throbbing pain, sensitivity to light/sound, and nausea. Migraines can be preceded by neurological symptoms called auras. Auras can have many different symptoms including vision changes (zigzags, kaleidoscope vision, flashing lights, blurred vision), numbness and tingling (usually on once side of the hand or face), difficulty with language (reading or speaking), and weakness on one side. Many times these symptoms will occur before a migraine headache, but auras can occur without a headache as well. Auras can naturally change over time and may convert from one type to another (people with visual aura may eventually develop a sensory aura, etc). ? ? ?Natural supplements that can reduce migraines: ?Magnesium Oxide or Magnesium Glycinate 500 mg at bed (up to 800 mg daily) ?Coenzyme Q10 300 mg in AM ?Vitamin B2- 200 mg twice a day ? ?Add 1 supplement at a time since even natural supplements can have undesirable side effects. You can sometimes buy supplements cheaper (especially Coenzyme Q10) at www.https://compton-perez.com/ or at [employer]. ? ?Magnesium: Magnesium (250 mg twice a day or 500 mg at bed) has a relaxant effect on smooth muscles such as blood vessels. Individuals suffering from frequent or daily headache usually have low magnesium levels which can be increase with daily supplementation of 400-800 mg. Three trials found 40-90% average headache reduction  when used as a preventative. Magnesium also demonstrated the benefit in menstrually related migraine.  Magnesium is part of the messenger system in the serotonin cascade and it is a good muscle relaxant.  It is also  useful for constipation. Good sources include nuts, whole grains, and tomatoes. Side Effects: loose stool/diarrhea ? ?Riboflavin (vitamin B 2) 200 mg twice a day. This vitamin assists nerve cells in the production of ATP a principal energy storing molecule.  It is necessary for many chemical reactions in the body.  There have been at least 3 clinical trials of riboflavin using 400 mg per day all of which suggested that migraine frequency can be decreased.  All 3 trials showed significant improvement in over half of migraine sufferers.  The supplement is found in bread, cereal, milk, meat, and poultry.  Most Americans get more riboflavin than the recommended daily allowance, however riboflavin deficiency is not necessary for the supplements to help prevent headache. Side effects: energizing, green urine ? ?Coenzyme Q10: This is present in almost all cells in the body and is critical component for the conversion of energy.  Recent studies have shown that a nutritional supplement of CoQ10 can reduce the frequency of migraine attacks by improving the energy production of cells as with riboflavin.  Doses of 150 mg twice a day have been shown to be effective. ? ?Melatonin: 5-10 mg before bedtime. Increasing evidence shows correlation between melatonin secretion and headache conditions.  Melatonin supplementation has decreased headache intensity and duration.  It is widely used as a sleep aid.   ? ?

## 2021-08-07 ENCOUNTER — Ambulatory Visit: Payer: No Typology Code available for payment source | Admitting: Physician Assistant

## 2021-10-15 ENCOUNTER — Ambulatory Visit: Payer: No Typology Code available for payment source | Admitting: Psychiatry

## 2021-12-30 ENCOUNTER — Emergency Department
Admission: EM | Admit: 2021-12-30 | Discharge: 2021-12-30 | Disposition: A | Discharge status: Home / Self Care | Payer: No Typology Code available for payment source | Attending: Emergency Medicine | Admitting: Emergency Medicine

## 2021-12-30 ENCOUNTER — Other Ambulatory Visit: Payer: Self-pay

## 2021-12-30 ENCOUNTER — Emergency Department: Payer: No Typology Code available for payment source

## 2021-12-30 DIAGNOSIS — M542 Cervicalgia: Secondary | ICD-10-CM | POA: Diagnosis not present

## 2021-12-30 DIAGNOSIS — R55 Syncope and collapse: Secondary | ICD-10-CM | POA: Insufficient documentation

## 2021-12-30 LAB — CBC WITH DIFFERENTIAL/PLATELET
Abs Immature Granulocytes: 0.01 10*3/uL (ref 0.00–0.07)
Basophils Absolute: 0 10*3/uL (ref 0.0–0.1)
Basophils Relative: 0 %
Eosinophils Absolute: 0.1 10*3/uL (ref 0.0–0.5)
Eosinophils Relative: 2 %
HCT: 44.2 % (ref 36.0–46.0)
Hemoglobin: 15.3 g/dL — ABNORMAL HIGH (ref 12.0–15.0)
Immature Granulocytes: 0 %
Lymphocytes Relative: 22 %
Lymphs Abs: 1.3 10*3/uL (ref 0.7–4.0)
MCH: 30.4 pg (ref 26.0–34.0)
MCHC: 34.6 g/dL (ref 30.0–36.0)
MCV: 87.9 fL (ref 80.0–100.0)
Monocytes Absolute: 0.4 10*3/uL (ref 0.1–1.0)
Monocytes Relative: 6 %
Neutro Abs: 4.1 10*3/uL (ref 1.7–7.7)
Neutrophils Relative %: 70 %
Platelets: 282 10*3/uL (ref 150–400)
RBC: 5.03 MIL/uL (ref 3.87–5.11)
RDW: 11.9 % (ref 11.5–15.5)
WBC: 5.9 10*3/uL (ref 4.0–10.5)
nRBC: 0 % (ref 0.0–0.2)

## 2021-12-30 LAB — BASIC METABOLIC PANEL
Anion gap: 8 (ref 5–15)
BUN: 16 mg/dL (ref 6–20)
CO2: 24 mmol/L (ref 22–32)
Calcium: 9.6 mg/dL (ref 8.9–10.3)
Chloride: 107 mmol/L (ref 98–111)
Creatinine, Ser: 0.69 mg/dL (ref 0.44–1.00)
GFR, Estimated: >60 mL/min (ref >60)
Glucose, Bld: 98 mg/dL (ref 70–99)
Potassium: 3.9 mmol/L (ref 3.5–5.1)
Sodium: 139 mmol/L (ref 135–145)

## 2021-12-30 LAB — POC URINE PREG, ED: Preg Test, Ur: NEGATIVE

## 2021-12-30 MED ORDER — ACETAMINOPHEN 325 MG PO TABS
650.0000 mg | ORAL_TABLET | Freq: Once | ORAL | Status: AC
Start: 2021-12-30 — End: 2021-12-30
  Administered 2021-12-30: 650 mg via ORAL
  Filled 2021-12-30: qty 2

## 2021-12-30 MED ORDER — LIDOCAINE 5 % EX PTCH
1.0000 | MEDICATED_PATCH | CUTANEOUS | Status: DC
  Administered 2021-12-30: 1 via TRANSDERMAL
  Filled 2021-12-30: qty 1

## 2021-12-30 MED ORDER — LIDOCAINE 5 % EX PTCH
1.0000 | MEDICATED_PATCH | Freq: Two times a day (BID) | CUTANEOUS | 0 refills | Status: AC
Start: 2021-12-30 — End: 2022-01-04
  Filled 2021-12-30: qty 10, 5d supply, fill #0

## 2021-12-30 MED ORDER — IOHEXOL 350 MG/ML SOLN
75.0000 mL | Freq: Once | INTRAVENOUS | Status: AC | PRN
  Administered 2021-12-30: 75 mL via INTRAVENOUS

## 2021-12-30 NOTE — Discharge Instructions (Signed)
Your CT scan, EKG, labs are normal. Continue to apply warm compresses to the area and take tylenol/motrin per packages instructions. Return for any new, worsening, or change in symptoms or other concerns.

## 2021-12-30 NOTE — ED Triage Notes (Signed)
Reports was sleeping and popped her neck and now can't move her neck and reports was walking back from the bathroom and had syncopal episode.  Patient has hx of scoliosis and rods in her back.  Patient ambulatory in triage and moving all extremities.

## 2021-12-30 NOTE — ED Notes (Signed)
See triage note  Presents with neck pain  States she woke up with pain   thinks she slept wrong  Then had a syncopal episode this am

## 2021-12-30 NOTE — ED Provider Notes (Signed)
[facility] Provider Note    Event Date/Time   First MD Initiated Contact with Patient 12/30/21 [phone]     (approximate)   History   Torticollis and Loss of Consciousness   HPI  Kathleen Cohen is a 21 y.o. female with a past medical history of scoliosis status post surgery who presents today for evaluation of neck injury and syncope.  Patient reports that she rolled over in bed and felt a sharp pain and pop in the left side of her neck.  She reports that she went to get up to use the bathroom and when she was walking back to bed she reports that she syncopized.  She is unsure how long she was unconscious for, but estimates less than 1 minute.  She reports that she has passed out before.  She reports that she has had pain with turning her neck ever since.  She denies headache, visual changes, nausea, vomiting, paresthesias.  No tenderness.  Patient Active Problem List   Diagnosis Date Noted   Numbness and tingling in both hands 06/05/2021   GAD (generalized anxiety disorder) 06/05/2021   Screening for blood or protein in urine 04/09/2020   BMI 30.0-30.9,adult 04/09/2020   Vitamin D deficiency 04/09/2020   Adolescent idiopathic scoliosis 04/06/2020   ADD (attention deficit disorder) 05/22/2014   Scoliosis 09/21/2011          Physical Exam   Triage Vital Signs: ED Triage Vitals  Enc Vitals Group     BP 12/30/21 0816 (!) 141/87     Pulse Rate 12/30/21 0816 91     Resp 12/30/21 0816 18     Temp 12/30/21 0816 98.4 F (36.9 C)     Temp Source 12/30/21 0816 Oral     SpO2 12/30/21 0816 97 %     Weight 12/30/21 0817 165 lb (74.8 kg)     Height 12/30/21 0817 5\' 3"  (1.6 m)     Head Circumference --      Peak Flow --      Pain Score 12/30/21 0817 8     Pain Loc --      Pain Edu? --      Excl. in GC? --     Most recent vital signs: Vitals:   12/30/21 0816 12/30/21 1015  BP: (!) 141/87 (!) 140/80  Pulse: 91 88  Resp: 18 18  Temp: 98.4 F (36.9  C)   SpO2: 97% 98%    Physical Exam Vitals and nursing note reviewed.  Constitutional:      General: Awake and alert. No acute distress.    Appearance: Normal appearance. The patient is normal weight.  HENT:     Head: Normocephalic and atraumatic.     Mouth: Mucous membranes are moist.  Eyes:     General: PERRL. Normal EOMs        Right eye: No discharge.        Left eye: No discharge.     Conjunctiva/sclera: Conjunctivae normal.  Cardiovascular:     Rate and Rhythm: Normal rate and regular rhythm.     Pulses: Normal pulses.     Heart sounds: Normal heart sounds Pulmonary:     Effort: Pulmonary effort is normal. No respiratory distress.     Breath sounds: Normal breath sounds.  Abdominal:     Abdomen is soft. There is no abdominal tenderness. No rebound or guarding. No distention. Musculoskeletal:        General: No swelling. Normal  range of motion.     Cervical back: Normal range of motion and neck supple.  Tenderness along the left sternocleidomastoid. No midline cervical spine tenderness.  Full range of motion of neck.  Negative Spurling test.  Negative Lhermitte sign.  Normal strength and sensation in bilateral upper extremities. Normal grip strength bilaterally.  Normal intrinsic muscle function of the hand bilaterally.  Normal radial pulses bilaterally. Skin:    General: Skin is warm and dry.     Capillary Refill: Capillary refill takes less than 2 seconds.     Findings: No rash.  Neurological:     Mental Status: The patient is awake and alert.  Neurological: GCS 15 alert and oriented x3 Normal speech, no expressive or receptive aphasia or dysarthria Cranial nerves II through XII intact Normal visual fields 5 out of 5 strength in all 4 extremities with intact sensation throughout No extremity drift Normal finger-to-nose testing, no limb or truncal ataxia      ED Results / Procedures / Treatments   Labs (all labs ordered are listed, but only abnormal results  are displayed) Labs Reviewed  CBC WITH DIFFERENTIAL/PLATELET - Abnormal; Notable for the following components:      Result Value   Hemoglobin 15.3 (*)    All other components within normal limits  BASIC METABOLIC PANEL  POC URINE PREG, ED     EKG     RADIOLOGY I independently reviewed and interpreted imaging and agree with radiologists findings.     PROCEDURES:  Critical Care performed:   Procedures   MEDICATIONS ORDERED IN ED: Medications  lidocaine (LIDODERM) 5 % 1 patch (1 patch Transdermal Patch Applied 12/30/21 0842)  acetaminophen (TYLENOL) tablet 650 mg (650 mg Oral Given 12/30/21 0841)  iohexol (OMNIPAQUE) 350 MG/ML injection 75 mL (75 mLs Intravenous Contrast Given 12/30/21 0935)     IMPRESSION / MDM / ASSESSMENT AND PLAN / ED COURSE  I reviewed the triage vital signs and the nursing notes.   Differential diagnosis includes, but is not limited to, torticollis, muscle spasm, muscle strain, vertebral artery dissection, carotid artery dissection, arrhythmia, symptomatic anemia, electrolyte disarray.  Patient is awake and alert, hemodynamically stable and afebrile.  She demonstrates no acute work of breathing.  She is nontoxic in appearance.  She has no cervical spine tenderness, normal strength and sensation bilateral upper extremities.  EKG demonstrates no acute arrhythmia, normal intervals.  Given the syncopal event, will also obtain CTA for evaluation of carotid or vertebral artery dissection.  No neurological deficits.  Patient and mother are in agreement with this plan.  CTA is negative for any acute vascular injury.  Labs are overall reassuring.  EKG is nonischemic.  Patient feels significant improvement with the Lidoderm patch and requested prescription for this, which was sent to her pharmacy. [place] syncope rule puts her at low risk syncope. We discussed return precautions and outpatient follow-up.  Patient and mother understand and agree with plan.   Discharged in stable condition.  Patient's presentation is most consistent with acute complicated illness / injury requiring diagnostic workup.       FINAL CLINICAL IMPRESSION(S) / ED DIAGNOSES   Final diagnoses:  Syncope and collapse  Neck pain on left side     Rx / DC Orders   ED Discharge Orders          Ordered    lidocaine (LIDODERM) 5 %  Every 12 hours        12/30/21 0959  Note:  This document was prepared using Dragon voice recognition software and may include unintentional dictation errors.   Keturah Shavers 12/30/21 1103    Jene Every, MD 12/30/21 1140

## 2023-08-19 DIAGNOSIS — H5213 Myopia, bilateral: Secondary | ICD-10-CM | POA: Diagnosis not present

## 2023-08-19 DIAGNOSIS — H52223 Regular astigmatism, bilateral: Secondary | ICD-10-CM | POA: Diagnosis not present

## 2023-10-29 ENCOUNTER — Ambulatory Visit: Payer: Self-pay

## 2023-10-29 NOTE — Telephone Encounter (Signed)
 Patient has started exercising and has had two episodes Patient would like to have her A1c checked and discuss other bloodwork for a checkup Appointment made for Monday morning November 02, 2023 at 9:20am with Janna Ostwalt PA-C  FYI Only or Action Required?: FYI only for provider.  Patient was last seen in primary care on 06/05/2021 by Cyndi Shaver, PA-C.  Called Nurse Triage reporting Weakness.  Symptoms began one episode Monday and one episode this morning on the way to work.  Interventions attempted: Dietary changes.  Symptoms are: unchanged.  Triage Disposition: Home Care  Patient/caregiver understands and will follow disposition?:        Copied from CRM [id number]. Topic: Clinical - Red Word Triage >> Oct 29, 2023  9:52 AM DeAngela L wrote: Red Word that prompted transfer to Nurse Triage: patient is having blood sugar concerns and she felt dizzy had tunnel vision like she was going to faint and she took some sugar and 15 minutes later it stopped she had to pull over to get settled befoe she coulod continue driving  Pt num [phone] (M) Reason for Disposition  Poor fluid intake probably caused the weakness  Answer Assessment - Initial Assessment Questions No symptoms during triage with this RN No medications History of Type 2 diabetes in family Episode this morning around 9am Had to pull over and eat and felt better Last episode was Monday Trying to exercise more Unsure if her blood sugar is getting low at times Wants to have her A1c checked and discuss other labwork Patient is advised that if anything worsens to go to the Emergency Room. Patient verbalized understanding.    1. DESCRIPTION: Describe how you are feeling.     Patient states she felt weak and almost like she was going to pass out until she got something to drink and eat then she felt better---same thing happened Monday after exercising for 45 minutes---on a walk---came inside out of the heat  and got something to eat and drink and felt better 2. SEVERITY: How bad is it?  Can you stand and walk?     yes 3. ONSET: When did these symptoms begin? (e.g., hours, days, weeks, months)     One episode Monday and one episode on the way to work this morning 4. CAUSE: What do you think is causing the weakness or fatigue? (e.g., not drinking enough fluids, medical problem, trouble sleeping)     Unsure--trying to exercise more 5. NEW MEDICINES:  Have you started on any new medicines recently? (e.g., opioid pain medicines, benzodiazepines, muscle relaxants, antidepressants, antihistamines, neuroleptics, beta blockers)     denies 6. OTHER SYMPTOMS: Do you have any other symptoms? (e.g., chest pain, fever, cough, SOB, vomiting, diarrhea, bleeding, other areas of pain)     Denies any at this time 7. PREGNANCY: Is there any chance you are pregnant? When was your last menstrual period?     No--first week in september  Protocols used: Weakness (Generalized) and Fatigue-A-AH

## 2023-11-02 ENCOUNTER — Ambulatory Visit (INDEPENDENT_AMBULATORY_CARE_PROVIDER_SITE_OTHER): Payer: Self-pay | Admitting: Physician Assistant

## 2023-11-02 VITALS — BP 128/79 | HR 91 | Temp 98.4°F | Ht 63.0 in | Wt 177.0 lb

## 2023-11-02 DIAGNOSIS — G43809 Other migraine, not intractable, without status migrainosus: Secondary | ICD-10-CM

## 2023-11-02 DIAGNOSIS — G5603 Carpal tunnel syndrome, bilateral upper limbs: Secondary | ICD-10-CM | POA: Diagnosis not present

## 2023-11-02 DIAGNOSIS — E559 Vitamin D deficiency, unspecified: Secondary | ICD-10-CM | POA: Diagnosis not present

## 2023-11-02 DIAGNOSIS — E66811 Obesity, class 1: Secondary | ICD-10-CM

## 2023-11-02 DIAGNOSIS — R531 Weakness: Secondary | ICD-10-CM | POA: Diagnosis not present

## 2023-11-02 DIAGNOSIS — Z789 Other specified health status: Secondary | ICD-10-CM

## 2023-11-02 DIAGNOSIS — Z6830 Body mass index (BMI) 30.0-30.9, adult: Secondary | ICD-10-CM | POA: Diagnosis not present

## 2023-11-02 NOTE — Progress Notes (Signed)
 " Established patient visit  Patient: Kathleen Cohen   DOB: [birth year]   23 y.o. Female  MRN: [id number] Visit Date: 11/02/2023  Today's healthcare provider: Jolynn Spencer, PA-C   Chief Complaint  Patient presents with   Weakness    Patient reports having episodes of weakness and feeling bad that is relieved after eating.  She would like to have labs including A1C checked.   Subjective      Discussed the use of AI scribe software for clinical note transcription with the patient, who gave verbal consent to proceed.  History of Present Illness Kathleen Cohen is a 23 year old female who presents with episodes of weakness and feeling unwell, relieved by eating.  She experiences episodes of weakness and feeling unwell, relieved by eating. On October 26, 2023, after a 40-minute walk, she felt her skin vibrating and was shaky, improving 20 minutes after eating grilled chicken, potatoes, and carrots. Another episode occurred while driving to work, with tunnel vision and near syncope, alleviated by applesauce and a protein bar.  Her eating schedule includes breakfast around 9 AM, lunch around 12 or 1 PM, and dinner by 7 or 7:30 PM. She had not eaten on the morning of the episode while driving. She denies recent medication changes, maintaining the same regimen for over two years.  She has migraines, with a significant episode on October 21, 2023, involving blurry vision and tongue numbness, resolving in a few hours. Her menstrual periods are heavy initially, but she does not link them to her weakness.  She drinks about 75 ounces of water daily and has a history of vitamin D  deficiency. She is concerned about diabetes due to her husband's condition and family history. She denies chest pain, shortness of breath, rapid heartbeats, or current headaches. She occasionally experiences dizziness, particularly when shaky, but not regularly with position changes.       11/02/2023    9:20 AM 06/05/2021     3:06 PM 04/09/2020    9:43 AM  Depression screen PHQ 2/9  Decreased Interest 0 0 0  Down, Depressed, Hopeless 0 0 0  PHQ - 2 Score 0 0 0  Altered sleeping 0  0  Tired, decreased energy 0  1  Change in appetite 0  0  Feeling bad or failure about yourself  0  1  Trouble concentrating 0  0  Moving slowly or fidgety/restless 0  0  Suicidal thoughts 0  0  PHQ-9 Score 0  2  Difficult doing work/chores Not difficult at all  Not difficult at all      11/02/2023    9:20 AM  GAD 7 : Generalized Anxiety Score  Nervous, Anxious, on Edge 1  Control/stop worrying 0  Worry too much - different things 0  Trouble relaxing 1  Restless 0  Easily annoyed or irritable 0  Afraid - awful might happen 0  Total GAD 7 Score 2  Anxiety Difficulty Not difficult at all    Medications: Outpatient Medications Prior to Visit  Medication Sig   FLUoxetine  (PROZAC ) 10 MG capsule Take 1 capsule (10 mg total) by mouth daily.   rizatriptan  (MAXALT ) 10 MG tablet Take 1 tablet (10 mg total) by mouth as needed for migraine. May repeat in 2 hours if needed   No facility-administered medications prior to visit.    Review of Systems All negative Except see HPI       Objective    BP 128/79 (BP Location: Left Arm,  Patient Position: Sitting, Cuff Size: Normal)   Pulse 91   Temp 98.4 F (36.9 C) (Oral)   Ht 5' 3 (1.6 m)   Wt 177 lb (80.3 kg)   SpO2 100%   BMI 31.35 kg/m     Physical Exam Vitals reviewed.  Constitutional:      General: She is not in acute distress.    Appearance: Normal appearance. She is well-developed. She is not diaphoretic.  HENT:     Head: Normocephalic and atraumatic.  Eyes:     General: No scleral icterus.    Conjunctiva/sclera: Conjunctivae normal.  Neck:     Thyroid : No thyromegaly.  Cardiovascular:     Rate and Rhythm: Normal rate and regular rhythm.     Pulses: Normal pulses.     Heart sounds: Normal heart sounds. No murmur heard. Pulmonary:     Effort:  Pulmonary effort is normal. No respiratory distress.     Breath sounds: Normal breath sounds. No wheezing, rhonchi or rales.  Musculoskeletal:     Cervical back: Neck supple.     Right lower leg: No edema.     Left lower leg: No edema.  Lymphadenopathy:     Cervical: No cervical adenopathy.  Skin:    General: Skin is warm and dry.     Findings: No rash.  Neurological:     Mental Status: She is alert and oriented to person, place, and time. Mental status is at baseline.  Psychiatric:        Mood and Affect: Mood normal.        Behavior: Behavior normal.      No results found for any visits on 11/02/23.      Assessment & Plan Episodes of weakness and shakiness relieved by eating Family history of type 2 diabetes noted. - Order blood tests for hemoglobin, electrolytes, liver function, kidney function, diabetes, cholesterol, and thyroid  function. - Advise carrying healthy snacks such as nuts, dried fruits, or protein shakes. - Encourage regular meals with healthy snacks between meals. - Recheck vitamin D  levels. - Insulin  and C-Peptide - Proinsulin Libre 3 sensor/sample was provided for 14-day continuous glucose monitoring Will reassess  Migraine Chronic History of migraines with blurry vision and tongue numbness. Last severe episode on September 17th. Continue current symptomatic treatment Consider referral to neurology Will monitor  Possible carpal tunnel syndrome Intermittent thumb pain and locking suggest early carpal tunnel syndrome, possibly occupationally related. - Advise stretching exercises for hands. - Recommend contrast baths with hot and cold water to reduce inflammation. - Suggest using entire body for support during work to reduce hand strain. Consider using braces at night Will follow-up  Vitamin D  deficiency Previous vitamin D  deficiency, current status unknown. - Recheck vitamin D  levels. Will reassess  Transition of care  From [facility] Will follow-up  BMI 30.0-30.9,adult Obesity (BMI 30.0-34.9) Chronic and stable Weight loss of 5% of pt's current weight via healthy diet and daily exercise encouraged. Workup ordered - CBC with Differential/Platelet - Comprehensive metabolic panel with GFR - Hemoglobin A1c - Lipid panel - TSH Will reassess after  receiving lab results  Vitamin D  deficiency In the past Will recheck - VITAMIN D  25 Hydroxy (Vit-D Deficiency, Fractures) Will follow-up   Orders Placed This Encounter  Procedures   CBC with Differential/Platelet   Comprehensive metabolic panel with GFR    Has the patient fasted?:   Yes   Hemoglobin A1c   Lipid panel    Has the patient  fasted?:   Yes   TSH   VITAMIN D  25 Hydroxy (Vit-D Deficiency, Fractures)    Return in about 1 month (around 12/02/2023).   The patient was advised to call back or seek an in-person evaluation if the symptoms worsen or if the condition fails to improve as anticipated.  I discussed the assessment and treatment plan with the patient. The patient was provided an opportunity to ask questions and all were answered. The patient agreed with the plan and demonstrated an understanding of the instructions.  I, Brunilda Eble, PA-C have reviewed all documentation for this visit. The documentation on 11/02/2023  for the exam, diagnosis, procedures, and orders are all accurate and complete.  Jolynn Spencer, [facility], MMS [facility] [phone] (phone) [phone] (fax)  [facility] "

## 2023-11-03 ENCOUNTER — Telehealth: Payer: Self-pay

## 2023-11-03 ENCOUNTER — Encounter: Payer: Self-pay | Admitting: Physician Assistant

## 2023-11-03 MED ORDER — FREESTYLE LIBRE 3 PLUS SENSOR MISC
Status: AC
Start: 2023-11-03 — End: ?

## 2023-11-03 NOTE — Telephone Encounter (Signed)
 Called pt to inform regarding additional labs being added on per provider and to come by office to pick up sample for Libre 3 Plus. No answer when called but to return call if she had any other questions.

## 2023-11-03 NOTE — Addendum Note (Signed)
 Addended by: WILFRED HARGIS RAMAN on: 11/03/2023 08:44 AM   Modules accepted: Orders

## 2023-11-04 DIAGNOSIS — E559 Vitamin D deficiency, unspecified: Secondary | ICD-10-CM | POA: Diagnosis not present

## 2023-11-04 DIAGNOSIS — R531 Weakness: Secondary | ICD-10-CM | POA: Diagnosis not present

## 2023-11-05 ENCOUNTER — Ambulatory Visit: Payer: Self-pay | Admitting: Physician Assistant

## 2023-11-05 LAB — COMPREHENSIVE METABOLIC PANEL WITH GFR
ALT: 14 IU/L (ref 0–32)
AST: 20 IU/L (ref 0–40)
Albumin: 3.9 g/dL — ABNORMAL LOW (ref 4.0–5.0)
Alkaline Phosphatase: 61 IU/L (ref 41–116)
BUN/Creatinine Ratio: 14 (ref 9–23)
BUN: 11 mg/dL (ref 6–20)
Bilirubin Total: 0.8 mg/dL (ref 0.0–1.2)
CO2: 20 mmol/L (ref 20–29)
Calcium: 9.3 mg/dL (ref 8.7–10.2)
Chloride: 106 mmol/L (ref 96–106)
Creatinine, Ser: 0.78 mg/dL (ref 0.57–1.00)
Globulin, Total: 2.8 g/dL (ref 1.5–4.5)
Glucose: 88 mg/dL (ref 70–99)
Potassium: 4.2 mmol/L (ref 3.5–5.2)
Sodium: 140 mmol/L (ref 134–144)
Total Protein: 6.7 g/dL (ref 6.0–8.5)
eGFR: 109 mL/min/1.73 (ref >59)

## 2023-11-05 LAB — LIPID PANEL
Chol/HDL Ratio: 3.2 ratio (ref 0.0–4.4)
Cholesterol, Total: 142 mg/dL (ref 100–199)
HDL: 44 mg/dL (ref >39)
LDL Chol Calc (NIH): 86 mg/dL (ref 0–99)
Triglycerides: 55 mg/dL (ref 0–149)
VLDL Cholesterol Cal: 12 mg/dL (ref 5–40)

## 2023-11-05 LAB — CBC WITH DIFFERENTIAL/PLATELET
Basophils Absolute: 0 x10E3/uL (ref 0.0–0.2)
Basos: 0 %
EOS (ABSOLUTE): 0.2 x10E3/uL (ref 0.0–0.4)
Eos: 4 %
Hematocrit: 41.5 % (ref 34.0–46.6)
Hemoglobin: 13.5 g/dL (ref 11.1–15.9)
Immature Grans (Abs): 0 x10E3/uL (ref 0.0–0.1)
Immature Granulocytes: 0 %
Lymphocytes Absolute: 1.4 x10E3/uL (ref 0.7–3.1)
Lymphs: 29 %
MCH: 30.3 pg (ref 26.6–33.0)
MCHC: 32.5 g/dL (ref 31.5–35.7)
MCV: 93 fL (ref 79–97)
Monocytes Absolute: 0.4 x10E3/uL (ref 0.1–0.9)
Monocytes: 8 %
Neutrophils Absolute: 2.9 x10E3/uL (ref 1.4–7.0)
Neutrophils: 59 %
Platelets: 301 x10E3/uL (ref 150–450)
RBC: 4.45 x10E6/uL (ref 3.77–5.28)
RDW: 11.7 % (ref 11.7–15.4)
WBC: 4.9 x10E3/uL (ref 3.4–10.8)

## 2023-11-05 LAB — HEMOGLOBIN A1C
Est. average glucose Bld gHb Est-mCnc: 97 mg/dL
Hgb A1c MFr Bld: 5 % (ref 4.8–5.6)

## 2023-11-05 LAB — TSH: TSH: 1.63 u[IU]/mL (ref 0.450–4.500)

## 2023-11-05 LAB — VITAMIN D 25 HYDROXY (VIT D DEFICIENCY, FRACTURES): Vit D, 25-Hydroxy: 27.5 ng/mL — ABNORMAL LOW (ref 30.0–100.0)

## 2023-11-06 LAB — INSULIN AND C-PEPTIDE, SERUM
C-Peptide: 2.4 ng/mL (ref 1.1–4.4)
INSULIN: 13.7 u[IU]/mL (ref 2.6–24.9)

## 2023-11-06 LAB — PROINSULIN: Proinsulin: 4 pmol/L (ref 0.0–10.0)

## 2024-01-26 ENCOUNTER — Telehealth: Admitting: Physician Assistant

## 2024-01-26 DIAGNOSIS — J069 Acute upper respiratory infection, unspecified: Secondary | ICD-10-CM | POA: Diagnosis not present

## 2024-01-26 MED ORDER — BENZONATATE 100 MG PO CAPS: 100.0000 mg | ORAL_CAPSULE | Freq: Three times a day (TID) | ORAL | 0 refills | Status: AC | PRN

## 2024-01-26 MED ORDER — IPRATROPIUM BROMIDE 0.03 % NA SOLN: 2.0000 | Freq: Two times a day (BID) | NASAL | 12 refills | Status: AC | PRN

## 2024-01-26 NOTE — Progress Notes (Signed)
